# Patient Record
Sex: Male | Born: 1955 | Race: White | Hispanic: No | Marital: Single | Smoking: Current some day smoker
Health system: Southern US, Community
[De-identification: ages and names within clinical notes are randomized; demographics above are authoritative.]

## PROBLEM LIST (undated history)

## (undated) ENCOUNTER — Ambulatory Visit: Admission: EM | Discharge status: Absent without leave | Payer: 59

## (undated) DIAGNOSIS — K219 Gastro-esophageal reflux disease without esophagitis: Secondary | ICD-10-CM

## (undated) DIAGNOSIS — I10 Essential (primary) hypertension: Secondary | ICD-10-CM

---

## 2016-05-10 ENCOUNTER — Emergency Department: Payer: 59

## 2016-05-10 ENCOUNTER — Encounter: Payer: Self-pay | Admitting: Urgent Care

## 2016-05-10 ENCOUNTER — Emergency Department
Admission: EM | Admit: 2016-05-10 | Discharge: 2016-05-10 | Disposition: A | Discharge status: Home / Self Care | Payer: 59 | Attending: Emergency Medicine | Admitting: Emergency Medicine

## 2016-05-10 DIAGNOSIS — F17200 Nicotine dependence, unspecified, uncomplicated: Secondary | ICD-10-CM | POA: Diagnosis not present

## 2016-05-10 DIAGNOSIS — K2970 Gastritis, unspecified, without bleeding: Secondary | ICD-10-CM | POA: Diagnosis not present

## 2016-05-10 DIAGNOSIS — R079 Chest pain, unspecified: Secondary | ICD-10-CM | POA: Insufficient documentation

## 2016-05-10 LAB — BASIC METABOLIC PANEL
ANION GAP: 8 (ref 5–15)
BUN: 16 mg/dL (ref 6–20)
CHLORIDE: 108 mmol/L (ref 101–111)
CO2: 23 mmol/L (ref 22–32)
Calcium: 9.1 mg/dL (ref 8.9–10.3)
Creatinine, Ser: 1.03 mg/dL (ref 0.61–1.24)
GFR calc Af Amer: >60 mL/min (ref >60)
GLUCOSE: 131 mg/dL — AB (ref 65–99)
POTASSIUM: 4 mmol/L (ref 3.5–5.1)
Sodium: 139 mmol/L (ref 135–145)

## 2016-05-10 LAB — CBC
HEMATOCRIT: 43.1 % (ref 40.0–52.0)
HEMOGLOBIN: 15.4 g/dL (ref 13.0–18.0)
MCH: 32.4 pg (ref 26.0–34.0)
MCHC: 35.6 g/dL (ref 32.0–36.0)
MCV: 91 fL (ref 80.0–100.0)
PLATELETS: 184 10*3/uL (ref 150–440)
RBC: 4.74 MIL/uL (ref 4.40–5.90)
RDW: 12.9 % (ref 11.5–14.5)
WBC: 9.5 10*3/uL (ref 3.8–10.6)

## 2016-05-10 LAB — TROPONIN I

## 2016-05-10 MED ORDER — FAMOTIDINE 20 MG PO TABS: 20.0000 mg | ORAL_TABLET | Freq: Two times a day (BID) | ORAL | 0 refills | Status: DC

## 2016-05-10 MED ORDER — FAMOTIDINE 20 MG PO TABS
40.0000 mg | ORAL_TABLET | Freq: Once | ORAL | Status: AC
  Administered 2016-05-10: 40 mg via ORAL
  Filled 2016-05-10: qty 2

## 2016-05-10 MED ORDER — ASPIRIN 81 MG PO CHEW
324.0000 mg | CHEWABLE_TABLET | Freq: Once | ORAL | Status: AC
  Administered 2016-05-10: 324 mg via ORAL
  Filled 2016-05-10: qty 4

## 2016-05-10 MED ORDER — ONDANSETRON 4 MG PO TBDP: 4.0000 mg | ORAL_TABLET | Freq: Three times a day (TID) | ORAL | 0 refills | Status: DC | PRN

## 2016-05-10 MED ORDER — GI COCKTAIL ~~LOC~~
30.0000 mL | ORAL | Status: AC
  Administered 2016-05-10: 30 mL via ORAL
  Filled 2016-05-10: qty 30

## 2016-05-10 MED ORDER — ALUMINUM-MAGNESIUM-SIMETHICONE 200-200-20 MG/5ML PO SUSP: 30.0000 mL | Freq: Three times a day (TID) | ORAL | 0 refills | Status: DC

## 2016-05-10 NOTE — ED Triage Notes (Signed)
Patient presents with c/o RIGHT side chest pain with (+) nausea. Patient reports that symptoms started after eating a hotdog [place] [employer] game. Questioning indigestion.

## 2016-05-10 NOTE — ED Provider Notes (Signed)
[facility] Emergency Department Provider Note  ____________________________________________  Time seen: Approximately 6:58 AM  I have reviewed the triage vital signs and the nursing notes.   HISTORY  Chief Complaint Chest Pain and Nausea    HPI Kyle Castillo is a 60 y.o. male who complains of chest pain. Symptoms that are his chest nonradiating feels like tightness and burning, no associated vomiting shortness of breath dizziness or diaphoresis. Not exertional, not pleuritic. No aggravating or alleviating factors. He went to a baseball game this evening, and ate a foot long hot dog with coleslaw and chilly at 10 PM. He then went to bed at 11 PM, and at midnight he woke back up with the chest pain. He never had anything like this before and comes to the ED for evaluation. Denies any medical history. Takes only over-the-counter herbal supplements.     History reviewed. No pertinent past medical history.   There are no active problems to display for this patient.    History reviewed. No pertinent surgical history.   Prior to Admission medications   Medication Sig Start Date End Date Taking? Authorizing Provider  aluminum-magnesium hydroxide-simethicone (MAALOX) 200-200-20 MG/5ML SUSP Take 30 mLs by mouth 4 (four) times daily -  before meals and at bedtime. 05/10/16   Sharman CheekPhillip Rosalin Buster, MD  famotidine (PEPCID) 20 MG tablet Take 1 tablet (20 mg total) by mouth 2 (two) times daily. 05/10/16   Sharman CheekPhillip Suede Greenawalt, MD  ondansetron (ZOFRAN ODT) 4 MG disintegrating tablet Take 1 tablet (4 mg total) by mouth every 8 (eight) hours as needed for nausea or vomiting. 05/10/16   Sharman CheekPhillip Pecola Haxton, MD     Allergies Review of patient's allergies indicates no known allergies.   History reviewed. No pertinent family history.  Social History Social History  Substance Use Topics  . Smoking status: Current Some Day Smoker  . Smokeless tobacco: Never Used  . Alcohol use Yes   No cigarette use, only occasional cigar.  Review of Systems  Constitutional:   No fever or chills.  ENT:   No sore throat.  Cardiovascular:   Positive as above chest pain. Respiratory:   No dyspnea or cough. Gastrointestinal:   Negative for abdominal pain, vomiting and diarrhea.   Musculoskeletal:   Negative for focal pain or swelling 10-point ROS otherwise negative.  ____________________________________________   PHYSICAL EXAM:  VITAL SIGNS: ED Triage Vitals  Enc Vitals Group     BP 05/10/16 0445 (!) 149/76     Pulse Rate 05/10/16 0445 (!) 37     Resp 05/10/16 0445 16     Temp 05/10/16 0445 98.1 F (36.7 C)     Temp Source 05/10/16 0445 Oral     SpO2 05/10/16 0445 99 %     Weight 05/10/16 0444 215 lb (97.5 kg)     Height 05/10/16 0444 6' (1.829 m)     Head Circumference --      Peak Flow --      Pain Score 05/10/16 0444 6     Pain Loc --      Pain Edu? --      Excl. in GC? --     Vital signs reviewed, nursing assessments reviewed.   Constitutional:   Alert and oriented. Well appearing and in no distress. Eyes:   No scleral icterus. No conjunctival pallor. PERRL. EOMI.  No nystagmus. ENT   Head:   Normocephalic and atraumatic.   Nose:   No congestion/rhinnorhea. No septal hematoma   Mouth/Throat:  MMM, no pharyngeal erythema. No peritonsillar mass.    Neck:   No stridor. No SubQ emphysema. No meningismus. Hematological/Lymphatic/Immunilogical:   No cervical lymphadenopathy. Cardiovascular:   RRR. Symmetric bilateral radial and DP pulses.  No murmurs.  Respiratory:   Normal respiratory effort without tachypnea nor retractions. Breath sounds are clear and equal bilaterally. No wheezes/rales/rhonchi. Gastrointestinal:   Soft and nontender. Non distended. There is no CVA tenderness.  No rebound, rigidity, or guarding. Genitourinary:   deferred Musculoskeletal:   Nontender with normal range of motion in all extremities. No joint effusions.  No lower  extremity tenderness.  No edema.Chest wall nontender Neurologic:   Normal speech and language.  CN 2-10 normal. Motor grossly intact. No gross focal neurologic deficits are appreciated.  Skin:    Skin is warm, dry and intact. No rash noted.  No petechiae, purpura, or bullae.  ____________________________________________    LABS (pertinent positives/negatives) (all labs ordered are listed, but only abnormal results are displayed) Labs Reviewed  BASIC METABOLIC PANEL - Abnormal; Notable for the following:       Result Value   Glucose, Bld 131 (*)    All other components within normal limits  CBC  TROPONIN I   ____________________________________________   EKG  Interpreted by me Sinus bradycardia rate of 40, and will axis intervals QRS ST segments and T waves  ____________________________________________    RADIOLOGY  Chest x-ray unremarkable  ____________________________________________   PROCEDURES Procedures  ____________________________________________   INITIAL IMPRESSION / ASSESSMENT AND PLAN / ED COURSE  Pertinent labs & imaging results that were available during my care of the patient were reviewed by me and considered in my medical decision making (see chart for details).  Patient well appearing no acute distress. Presents with nonspecific chest pain, most likely GERD or gastritis.Considering the patient's symptoms, medical history, and physical examination today, I have low suspicion for ACS, PE, TAD, pneumothorax, carditis, mediastinitis, pneumonia, CHF, or sepsis.  Labs unremarkable, which were drawn 5 hours after symptom onset. EKG unremarkable. Patient reports that he has a history of slow heart rate chronically. No clinical significance at this time. Encouraged to follow up with primary care.     Clinical Course   ____________________________________________   FINAL CLINICAL IMPRESSION(S) / ED DIAGNOSES  Final diagnoses:  Gastritis   Nonspecific chest pain  Asymptomatic bradycardia     Portions of this note were generated with dragon dictation software. Dictation errors may occur despite best attempts at proofreading.    Sharman Cheek, MD 05/10/16 [phone]

## 2017-08-19 DIAGNOSIS — Z23 Encounter for immunization: Secondary | ICD-10-CM | POA: Diagnosis not present

## 2017-10-28 DIAGNOSIS — Z0000 Encounter for general adult medical examination without abnormal findings: Secondary | ICD-10-CM | POA: Diagnosis not present

## 2017-10-28 DIAGNOSIS — N401 Enlarged prostate with lower urinary tract symptoms: Secondary | ICD-10-CM | POA: Diagnosis not present

## 2017-10-28 DIAGNOSIS — R0683 Snoring: Secondary | ICD-10-CM | POA: Diagnosis not present

## 2017-11-13 ENCOUNTER — Encounter: Payer: Self-pay | Admitting: *Deleted

## 2018-02-09 DIAGNOSIS — H524 Presbyopia: Secondary | ICD-10-CM | POA: Diagnosis not present

## 2018-02-19 IMAGING — CR DG CHEST 2V
1 series · 2 of 2 positions shown · non-contrast
Comparison: None.

CLINICAL DATA: RIGHT-sided chest pain and nausea.

EXAM:
CHEST  2 VIEW

[Series 1: dg chest 2 view · 0.14mm/px · 2 of 2 slices shown]
[im 1/2]
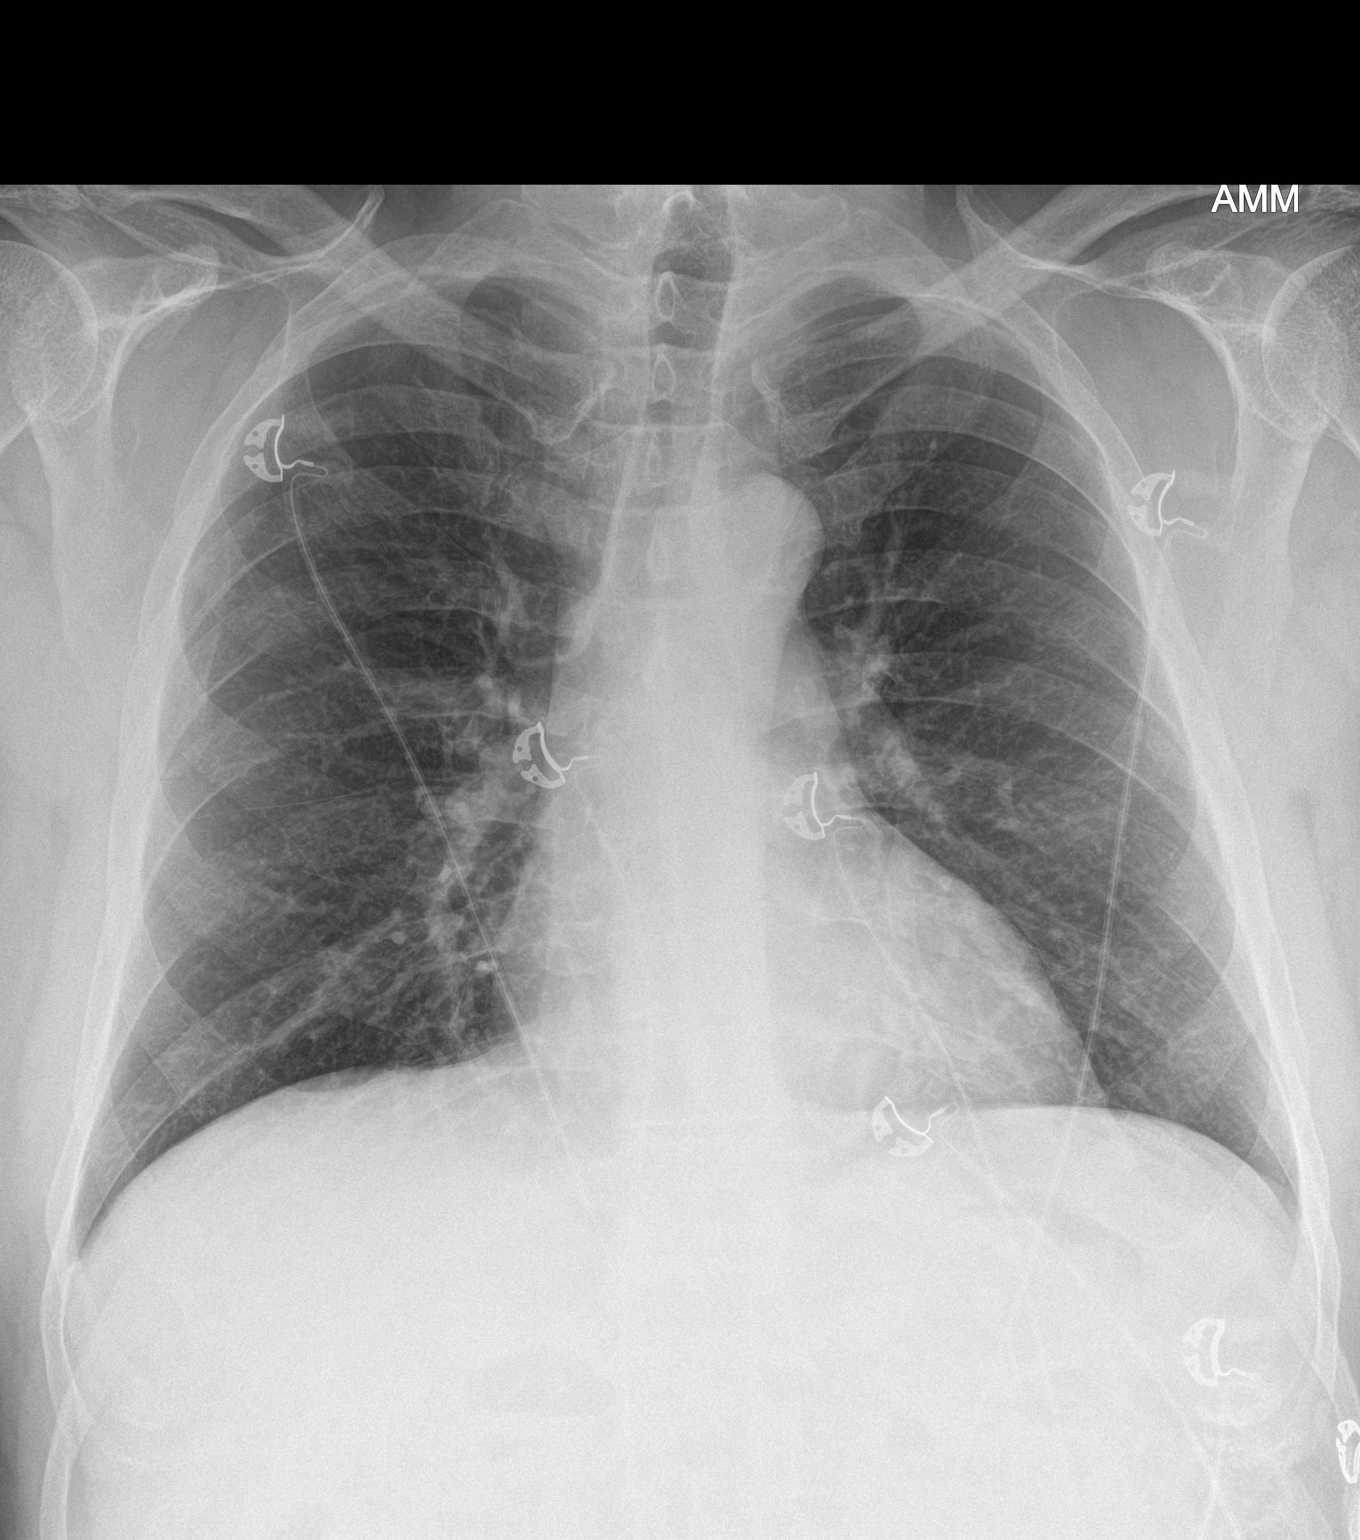
[im 2/2]
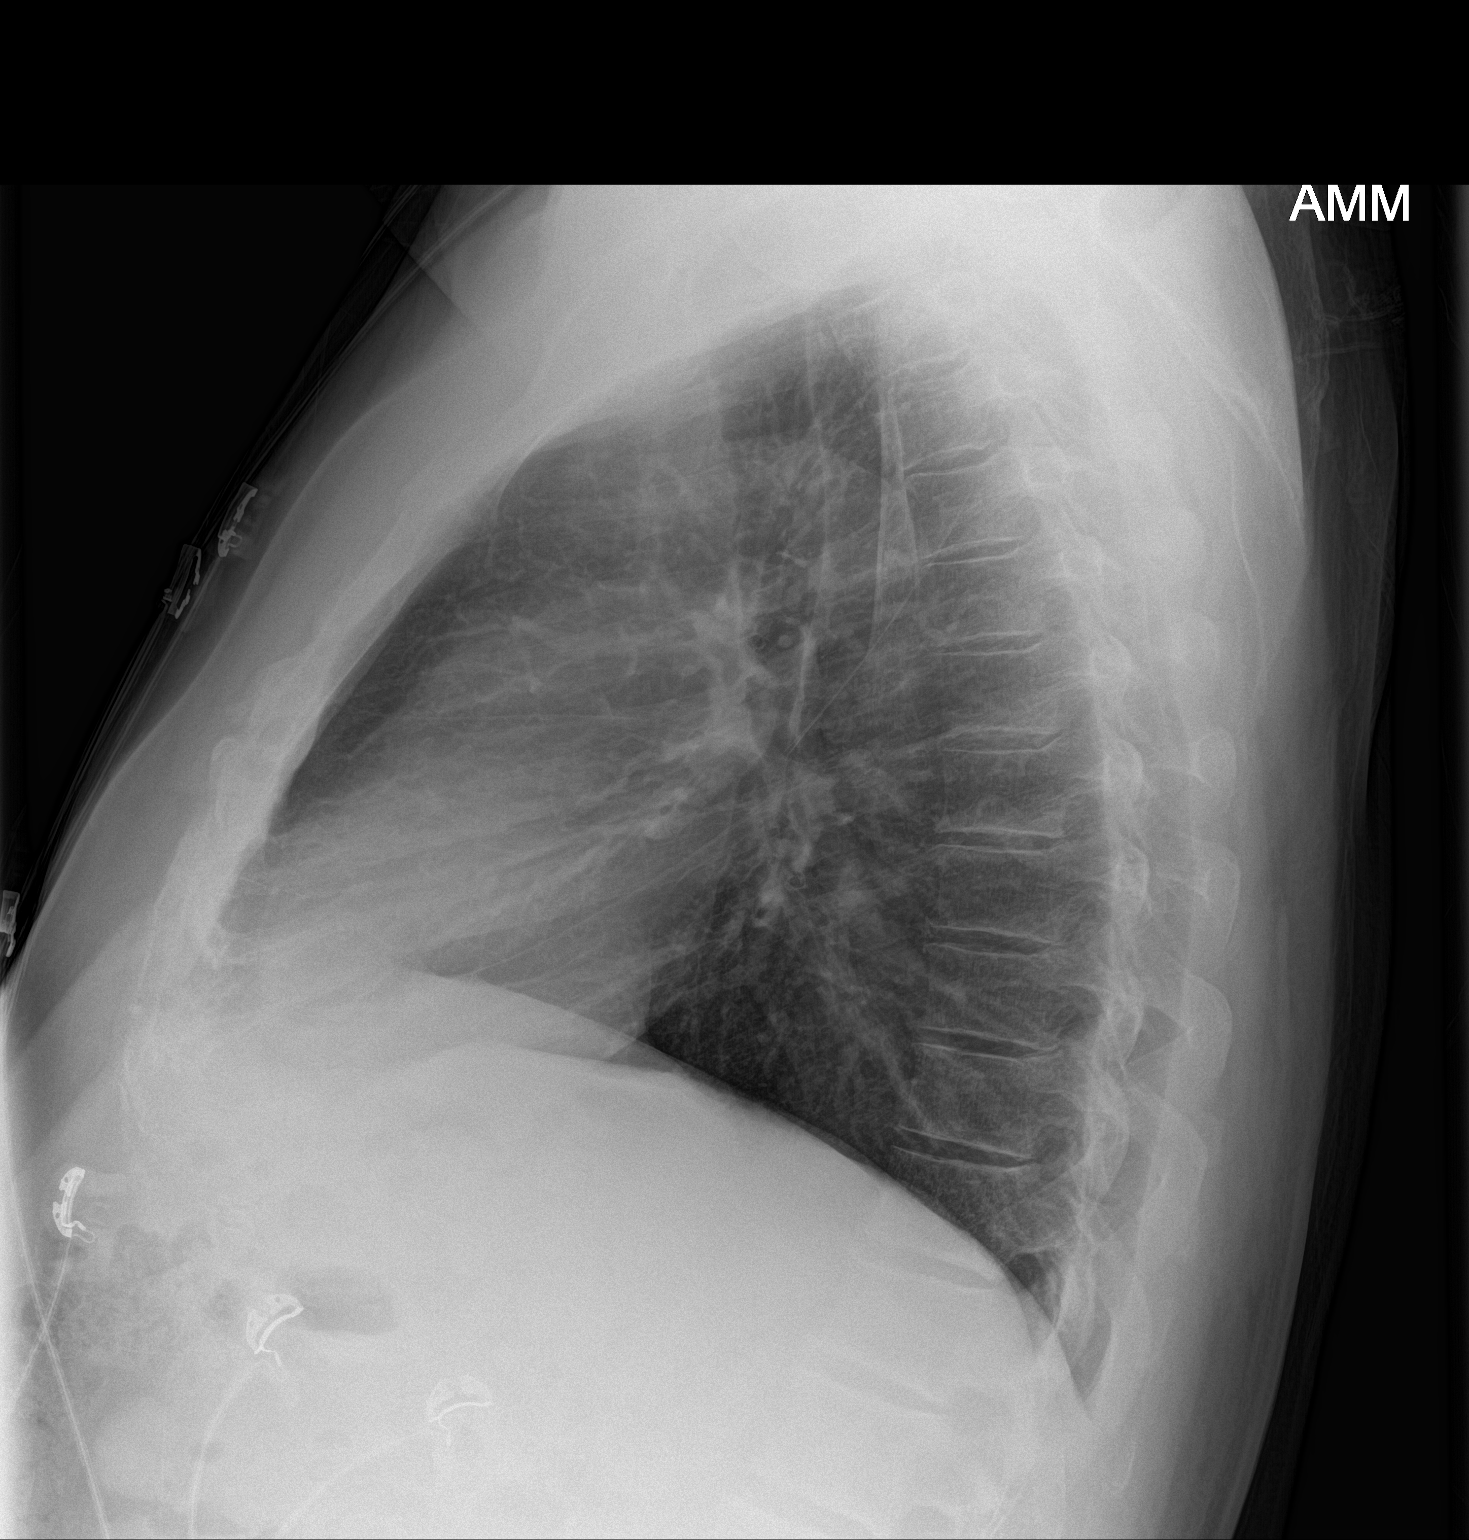

[2 of 2 positions shown; findings below may reference images not displayed]

FINDINGS: Cardiomediastinal silhouette is normal. No pleural effusions or
focal consolidations. Trachea projects midline and there is no
pneumothorax. Soft tissue planes and included osseous structures are
non-suspicious.
IMPRESSION: Normal chest.

## 2018-05-05 ENCOUNTER — Ambulatory Visit: Admission: EM | Admit: 2018-05-05 | Discharge: 2018-05-05 | Discharge status: Absent without leave | Payer: 59

## 2018-05-31 DIAGNOSIS — I10 Essential (primary) hypertension: Secondary | ICD-10-CM | POA: Diagnosis not present

## 2018-07-02 DIAGNOSIS — I10 Essential (primary) hypertension: Secondary | ICD-10-CM | POA: Diagnosis not present

## 2018-09-04 ENCOUNTER — Ambulatory Visit
Admission: EM | Admit: 2018-09-04 | Discharge: 2018-09-04 | Disposition: A | Discharge status: Home / Self Care | Payer: 59 | Attending: Family Medicine | Admitting: Family Medicine

## 2018-09-04 ENCOUNTER — Other Ambulatory Visit: Payer: Self-pay

## 2018-09-04 DIAGNOSIS — S61206A Unspecified open wound of right little finger without damage to nail, initial encounter: Secondary | ICD-10-CM | POA: Insufficient documentation

## 2018-09-04 DIAGNOSIS — W25XXXA Contact with sharp glass, initial encounter: Secondary | ICD-10-CM | POA: Diagnosis not present

## 2018-09-04 HISTORY — DX: Essential (primary) hypertension: I10

## 2018-09-04 HISTORY — DX: Gastro-esophageal reflux disease without esophagitis: K21.9

## 2018-09-04 MED ORDER — MUPIROCIN 2 % EX OINT
1.0000 | TOPICAL_OINTMENT | Freq: Two times a day (BID) | CUTANEOUS | 0 refills | Status: AC
Start: 2018-09-04 — End: 2018-09-11

## 2018-09-04 NOTE — ED Triage Notes (Addendum)
Right hand 5th finger cut with glass on Thursday. Shearing injury.

## 2018-09-04 NOTE — Discharge Instructions (Signed)
Keep clean and cover.  Medication as prescribed.  Take care  Dr. Adriana Simasook

## 2018-09-04 NOTE — ED Provider Notes (Signed)
[facility]    CSN: [id number] Arrival date & time: 09/04/18  1026  History   Chief Complaint Chief Complaint  Patient presents with  . Extremity Laceration   HPI  62 year old male presents with the above complaint.  Patient cut his right fifth digit on Thursday.  He cut it on a piece of glass.  Patient has a wound at the PIP joint on the dorsal aspect.  He reports drainage but that it is serous.  No purulent drainage.  Mild surrounding swelling.  Mild pain.  No fever.  No chills.  He has cover the wound and clean the wound.  No other medications or interventions tried.  No other associated symptoms.  Social Hx reviewed as below. Social History Social History   Tobacco Use  . Smoking status: Current Some Day Smoker    Types: Cigars  . Smokeless tobacco: Never Used  . Tobacco comment: weekends  Substance Use Topics  . Alcohol use: Yes    Comment: social  . Drug use: Never    Allergies   Patient has no known allergies.   Review of Systems Review of Systems  Constitutional: Negative.   Skin: Positive for wound.   Physical Exam Triage Vital Signs ED Triage Vitals  Enc Vitals Group     BP 09/04/18 1040 128/89     Pulse Rate 09/04/18 1040 60     Resp 09/04/18 1040 16     Temp 09/04/18 1040 98.3 F (36.8 C)     Temp Source 09/04/18 1040 Oral     SpO2 09/04/18 1040 100 %     Weight 09/04/18 1042 215 lb (97.5 kg)     Height 09/04/18 1042 5\' 11"  (1.803 m)     Head Circumference --      Peak Flow --      Pain Score 09/04/18 1042 2     Pain Loc --      Pain Edu? --      Excl. in GC? --    Updated Vital Signs BP 128/89 (BP Location: Left Arm)   Pulse 60   Temp 98.3 F (36.8 C) (Oral)   Resp 16   Ht 5\' 11"  (1.803 m)   Wt 97.5 kg   SpO2 100%   BMI 29.99 kg/m   Visual Acuity Right Eye Distance:   Left Eye Distance:   Bilateral Distance:    Right Eye Near:   Left Eye Near:    Bilateral Near:     Physical Exam Vitals signs and nursing  note reviewed.  Constitutional:      Appearance: Normal appearance.  HENT:     Head: Normocephalic and atraumatic.     Nose: Nose normal.  Eyes:     General: No scleral icterus.    Conjunctiva/sclera: Conjunctivae normal.  Pulmonary:     Effort: Pulmonary effort is normal. No respiratory distress.  Skin:    Comments: Right fifth digit -1 x 1.5 cm circular wound noted.  Serous drainage.  No purulence.  Mild swelling.  Minimal erythema.  Wound is superficial.  He essentially removed the top layers of skin.  Neurological:     Mental Status: He is alert.  Psychiatric:        Mood and Affect: Mood normal.        Behavior: Behavior normal.    UC Treatments / Results  Labs (all labs ordered are listed, but only abnormal results are displayed) Labs Reviewed - No data to display  EKG None  Radiology No results found.  Procedures Procedures (including critical care time)  Medications Ordered in UC Medications - No data to display  Initial Impression / Assessment and Plan / UC Course  I have reviewed the triage vital signs and the nursing notes.  Pertinent labs & imaging results that were available during my care of the patient were reviewed by me and considered in my medical decision making (see chart for details).    62 year old male presents with open wound of his right fifth digit.  Placing on Bactroban ointment.  Supportive care.  Final Clinical Impressions(s) / UC Diagnoses   Final diagnoses:  Open wound of right little finger without damage to nail, initial encounter     Discharge Instructions     Keep clean and cover.  Medication as prescribed.  Take care  Dr. Adriana Simasook    ED Prescriptions    Medication Sig Dispense Auth. Provider   mupirocin ointment (BACTROBAN) 2 % Apply 1 application topically 2 (two) times daily for 7 days. 22 g Tommie Samsook, Arena Lindahl G, DO     Controlled Substance Prescriptions [place] Controlled Substance Registry consulted? Not Applicable   Tommie SamsCook,  Kylie Gros G, DO 09/04/18 [id number]

## 2018-09-06 DIAGNOSIS — Z0130 Encounter for examination of blood pressure without abnormal findings: Secondary | ICD-10-CM | POA: Diagnosis not present

## 2019-03-02 ENCOUNTER — Telehealth: Payer: Self-pay

## 2019-03-02 NOTE — Telephone Encounter (Signed)
Unable to LVM for pt to call office back in regards to scheduling his colonoscopy for 03/21/19. This date is available at [facility]..  Thanks Sharyn Lull

## 2019-03-03 ENCOUNTER — Telehealth: Payer: Self-pay

## 2019-03-03 NOTE — Telephone Encounter (Signed)
Called patient again to discuss scheduling his Colonoscopy.  He wanted July 6th, but when informed about COVID test the test date would not work for his schedule.  I've emailed colonoscopy instructions to him to review, and help him in scheduling his procedure in the future.  Thanks [employer]

## 2019-03-17 ENCOUNTER — Encounter: Payer: Self-pay | Admitting: *Deleted
